# Patient Record
Sex: Male | Born: 1980 | Race: Black or African American | Hispanic: No | Marital: Married | Smoking: Light tobacco smoker
Health system: Southern US, Community
[De-identification: ages and names within clinical notes are randomized; demographics above are authoritative.]

---

## 1999-09-09 ENCOUNTER — Emergency Department (HOSPITAL_COMMUNITY): Admission: EM | Admit: 1999-09-09 | Discharge: 1999-09-09 | Payer: Self-pay | Admitting: Emergency Medicine

## 1999-09-09 ENCOUNTER — Encounter: Payer: Self-pay | Admitting: Emergency Medicine

## 2007-09-16 ENCOUNTER — Emergency Department (HOSPITAL_COMMUNITY): Admission: EM | Admit: 2007-09-16 | Discharge: 2007-09-16 | Payer: Self-pay | Admitting: Emergency Medicine

## 2008-07-07 ENCOUNTER — Emergency Department (HOSPITAL_COMMUNITY): Admission: EM | Admit: 2008-07-07 | Discharge: 2008-07-07 | Payer: Self-pay | Admitting: Family Medicine

## 2008-07-10 ENCOUNTER — Ambulatory Visit: Payer: Self-pay | Admitting: *Deleted

## 2008-07-10 DIAGNOSIS — J189 Pneumonia, unspecified organism: Secondary | ICD-10-CM | POA: Insufficient documentation

## 2008-08-10 ENCOUNTER — Ambulatory Visit (HOSPITAL_BASED_OUTPATIENT_CLINIC_OR_DEPARTMENT_OTHER): Admission: RE | Admit: 2008-08-10 | Discharge: 2008-08-10 | Payer: Self-pay | Admitting: *Deleted

## 2008-08-10 ENCOUNTER — Ambulatory Visit: Payer: Self-pay | Admitting: *Deleted

## 2008-08-10 ENCOUNTER — Ambulatory Visit: Payer: Self-pay | Admitting: Diagnostic Radiology

## 2008-08-10 DIAGNOSIS — E785 Hyperlipidemia, unspecified: Secondary | ICD-10-CM | POA: Insufficient documentation

## 2008-08-10 LAB — CONVERTED CEMR LAB
CO2: 27 meq/L (ref 19–32)
Calcium: 9.2 mg/dL (ref 8.4–10.5)
Chloride: 103 meq/L (ref 96–112)
Glucose, Bld: 86 mg/dL (ref 70–99)
Hemoglobin: 13.7 g/dL (ref 13.0–17.0)
LDL Cholesterol: 94 mg/dL (ref 0–99)
Lymphocytes Relative: 22.6 % (ref 12.0–46.0)
Monocytes Relative: 10.2 % (ref 3.0–12.0)
Neutro Abs: 4.1 10*3/uL (ref 1.4–7.7)
Neutrophils Relative %: 64.7 % (ref 43.0–77.0)
Potassium: 4.2 meq/L (ref 3.5–5.1)
RBC: 5.26 M/uL (ref 4.22–5.81)
RDW: 15.4 % — ABNORMAL HIGH (ref 11.5–14.6)
Sodium: 136 meq/L (ref 135–145)
TSH: 2.02 microintl units/mL (ref 0.35–5.50)
Total CHOL/HDL Ratio: 6.2
Triglycerides: 73 mg/dL (ref 0–149)
VLDL: 15 mg/dL (ref 0–40)

## 2009-01-11 ENCOUNTER — Ambulatory Visit: Payer: Self-pay | Admitting: Internal Medicine

## 2009-01-11 DIAGNOSIS — R634 Abnormal weight loss: Secondary | ICD-10-CM

## 2009-01-11 DIAGNOSIS — R05 Cough: Secondary | ICD-10-CM

## 2009-01-11 LAB — CONVERTED CEMR LAB
AST: 37 units/L (ref 0–37)
Albumin: 3.2 g/dL — ABNORMAL LOW (ref 3.5–5.2)
Alkaline Phosphatase: 413 units/L — ABNORMAL HIGH (ref 39–117)
Basophils Relative: 0.4 % (ref 0.0–3.0)
Bilirubin, Direct: 0 mg/dL (ref 0.0–0.3)
Eosinophils Relative: 1.8 % (ref 0.0–5.0)
Free T4: 1 ng/dL (ref 0.6–1.6)
Hemoglobin: 13.7 g/dL (ref 13.0–17.0)
Lymphocytes Relative: 26.2 % (ref 12.0–46.0)
Monocytes Relative: 9.6 % (ref 3.0–12.0)
Neutro Abs: 3.7 10*3/uL (ref 1.4–7.7)
Neutrophils Relative %: 62 % (ref 43.0–77.0)
RBC: 5.2 M/uL (ref 4.22–5.81)
TSH: 0.79 microintl units/mL (ref 0.35–5.50)
Total Protein: 8.5 g/dL — ABNORMAL HIGH (ref 6.0–8.3)
WBC: 5.9 10*3/uL (ref 4.5–10.5)

## 2009-01-12 ENCOUNTER — Telehealth: Payer: Self-pay | Admitting: Internal Medicine

## 2009-01-12 DIAGNOSIS — R748 Abnormal levels of other serum enzymes: Secondary | ICD-10-CM | POA: Insufficient documentation

## 2009-01-13 ENCOUNTER — Ambulatory Visit: Payer: Self-pay | Admitting: Diagnostic Radiology

## 2009-01-13 ENCOUNTER — Ambulatory Visit (HOSPITAL_BASED_OUTPATIENT_CLINIC_OR_DEPARTMENT_OTHER): Admission: RE | Admit: 2009-01-13 | Discharge: 2009-01-13 | Payer: Self-pay | Admitting: Internal Medicine

## 2009-01-13 ENCOUNTER — Telehealth: Payer: Self-pay | Admitting: Internal Medicine

## 2009-01-13 ENCOUNTER — Ambulatory Visit: Payer: Self-pay | Admitting: Internal Medicine

## 2009-01-14 ENCOUNTER — Telehealth: Payer: Self-pay | Admitting: Internal Medicine

## 2009-01-20 ENCOUNTER — Ambulatory Visit: Payer: Self-pay | Admitting: Emergency Medicine

## 2009-01-20 DIAGNOSIS — R930 Abnormal findings on diagnostic imaging of skull and head, not elsewhere classified: Secondary | ICD-10-CM

## 2009-01-20 LAB — CONVERTED CEMR LAB
INR: 1.3 — ABNORMAL HIGH (ref 0.8–1.0)
Prothrombin Time: 14.2 s — ABNORMAL HIGH (ref 10.9–13.3)
aPTT: 33.9 s — ABNORMAL HIGH (ref 21.7–28.8)

## 2009-01-26 ENCOUNTER — Ambulatory Visit: Admission: RE | Admit: 2009-01-26 | Discharge: 2009-01-26 | Payer: Self-pay | Admitting: Emergency Medicine

## 2009-01-26 ENCOUNTER — Ambulatory Visit: Payer: Self-pay | Admitting: Emergency Medicine

## 2009-01-26 ENCOUNTER — Encounter: Payer: Self-pay | Admitting: Emergency Medicine

## 2009-01-29 ENCOUNTER — Ambulatory Visit: Payer: Self-pay | Admitting: Emergency Medicine

## 2009-02-01 ENCOUNTER — Telehealth (INDEPENDENT_AMBULATORY_CARE_PROVIDER_SITE_OTHER): Payer: Self-pay | Admitting: *Deleted

## 2009-02-08 ENCOUNTER — Telehealth: Payer: Self-pay | Admitting: Emergency Medicine

## 2009-02-15 ENCOUNTER — Ambulatory Visit: Payer: Self-pay | Admitting: Internal Medicine

## 2009-02-15 ENCOUNTER — Ambulatory Visit (HOSPITAL_BASED_OUTPATIENT_CLINIC_OR_DEPARTMENT_OTHER): Admission: RE | Admit: 2009-02-15 | Discharge: 2009-02-15 | Payer: Self-pay | Admitting: Internal Medicine

## 2009-02-15 ENCOUNTER — Ambulatory Visit: Payer: Self-pay | Admitting: Diagnostic Radiology

## 2009-02-15 DIAGNOSIS — D689 Coagulation defect, unspecified: Secondary | ICD-10-CM

## 2009-02-15 LAB — CONVERTED CEMR LAB
ALT: 32 units/L (ref 0–53)
AST: 30 units/L (ref 0–37)
Albumin: 3.9 g/dL (ref 3.5–5.2)
Alkaline Phosphatase: 288 units/L — ABNORMAL HIGH (ref 39–117)
Bilirubin, Direct: 0.1 mg/dL (ref 0.0–0.3)
CRP: 1.8 mg/dL — ABNORMAL HIGH (ref <0.6)
GGT: 238 units/L — ABNORMAL HIGH (ref 7–51)
INR: 1 (ref 0.0–1.5)
Indirect Bilirubin: 0.4 mg/dL (ref 0.0–0.9)
Prothrombin Time: 13.9 s (ref 11.6–15.2)
Sed Rate: 10 mm/hr (ref 0–16)
Total Bilirubin: 0.5 mg/dL (ref 0.3–1.2)
Total Protein: 8.9 g/dL — ABNORMAL HIGH (ref 6.0–8.3)
aPTT: 36 s (ref 24–37)

## 2009-02-19 ENCOUNTER — Telehealth: Payer: Self-pay | Admitting: Internal Medicine

## 2009-02-26 ENCOUNTER — Encounter: Payer: Self-pay | Admitting: Emergency Medicine

## 2009-02-26 ENCOUNTER — Ambulatory Visit: Payer: Self-pay | Admitting: Emergency Medicine

## 2009-02-26 ENCOUNTER — Telehealth (INDEPENDENT_AMBULATORY_CARE_PROVIDER_SITE_OTHER): Payer: Self-pay | Admitting: *Deleted

## 2009-02-26 ENCOUNTER — Ambulatory Visit: Admission: RE | Admit: 2009-02-26 | Discharge: 2009-02-26 | Payer: Self-pay | Admitting: Emergency Medicine

## 2009-03-03 ENCOUNTER — Ambulatory Visit: Payer: Self-pay | Admitting: Emergency Medicine

## 2010-09-19 ENCOUNTER — Encounter: Payer: Self-pay | Admitting: Emergency Medicine

## 2010-09-25 LAB — CONVERTED CEMR LAB: Vit D, 1,25-Dihydroxy: 12 — ABNORMAL LOW (ref 30–89)

## 2010-12-04 LAB — CULTURE, RESPIRATORY W GRAM STAIN: Special Requests: ABNORMAL

## 2010-12-04 LAB — FUNGUS CULTURE W SMEAR: Fungal Smear: NONE SEEN

## 2010-12-04 LAB — AFB CULTURE WITH SMEAR (NOT AT ARMC): Special Requests: ABNORMAL

## 2010-12-05 LAB — CULTURE, RESPIRATORY W GRAM STAIN

## 2010-12-05 LAB — MISCELLANEOUS TEST

## 2010-12-05 LAB — FUNGUS CULTURE W SMEAR: Fungal Smear: NONE SEEN

## 2010-12-05 LAB — AFB CULTURE WITH SMEAR (NOT AT ARMC)

## 2011-01-10 NOTE — Op Note (Signed)
NAME:  Derek Maynard, Derek Maynard               ACCOUNT NO.:  [id number]   MEDICAL RECORD NO.:  [id number]          PATIENT TYPE:  AMB   LOCATION:  CARD                         FACILITY:  [facility]   PHYSICIAN:  Leslye Peer, MD    DATE OF BIRTH:  08/05/1981   DATE OF PROCEDURE:  02/26/2009  DATE OF DISCHARGE:                               OPERATIVE REPORT   PROCEDURE:  Fiberoptic bronchoscopy with transbronchial biopsies,  endobronchial biopsies, and endobronchial brushings.   INDICATION:  Abnormal CT scan of the chest.   OPERATOR:  Leslye Peer, MD.   MEDICATIONS GIVEN:  1. Fentanyl 150 mcg IV in divided doses.  2. Versed 3 mg IV in divided doses.  3. One percent lidocaine to the bronchoalveolar tree for approximately      32 mL total.   CONSENT:  Informed consent was obtained and a signed copy is on the  patient's chart.   PROCEDURE DETAILS:  After a formal time-out was performed and consent  was signed, conscious sedation was initiated as outlined above.  The  bronchoscope was introduced through the right naris without difficulty.  The posterior pharynx was visualized.  There was some nodularity  anterior to the epiglottis.  The cords were normal in appearance.  The  trachea was intubated without difficulty.  There was some white  nodularity on the mucosal wall anterior to the anterior left proximal  trachea.  Examination of the airways revealed a sharp main carina.  There was some mild hypopigmentation and white nodularity throughout the  entire exam.  This was especially notable in the left upper lobe and  lingular airways.  There was thickening of the left upper lobe lingular  carina as well as the right upper lobe carina.  Transbronchial biopsies  were performed under fluoroscopic guidance; first from the left upper  lobe and then from the right upper lobe.  These will be sent for  pathology.  Endobronchial brushings were then performed in the left  upper lobe airways in the  region of nodularity and also in the proximal  trachea in the region of nodularity.  These will be sent for cytology.  Finally, endobronchial biopsies were performed in the nodular lesions of  the left upper lobe and also the nodular region of the trachea.  These  will be sent for pathology.  Finally, bronchial washings were pooled and  these were sent for cytology and microbiology including AFB and fungal  smears.  The patient tolerated the procedure well.  There was  approximately 2 mL of blood loss total with good hemostasis at the end  of the case.  A chest x-ray to look for pneumothorax was pending at time  of this dictation.  He returned to recovery room in good condition.   SAMPLES:  1. Transbronchial biopsies from the left upper lobe.  2. Transbronchial biopsies from the right upper lobe.  3. Endobronchial brushings from the left upper lobe.  4. Endobronchial brushings from the trachea.  5. Endobronchial biopsies from the left upper lobe.  6. Endobronchial biopsies from the trachea.  7. Pooled bronchial  washings.   PLAN:  I will arrange for followup with Derek Maynard as an outpatient to  discuss his results and to plan further therapy.      Leslye Peer, MD  Electronically Signed     RSB/MEDQ  D:  02/26/2009  T:  02/26/2009  Job:  [id number]   cc:   Derek Corporal, MD

## 2011-01-10 NOTE — Op Note (Signed)
NAME:  Derek Maynard, Derek Maynard               ACCOUNT NO.:  [id number]   MEDICAL RECORD NO.:  [id number]          PATIENT TYPE:  AMB   LOCATION:  CARD                         FACILITY:  [facility]   PHYSICIAN:  Leslye Peer, MD    DATE OF BIRTH:  [birth year]   DATE OF PROCEDURE:  01/26/2009  DATE OF DISCHARGE:                               OPERATIVE REPORT   PROCEDURES:  1. Fiberoptic bronchoscopy with bronchoalveolar lavage.  2. Endobronchial biopsies.  3. Transbronchial biopsies and brushings.   OPERATOR:  Levy Pupa, MD.   INDICATION:  Abnormal CT scan of the chest.   CONSENT:  Informed consent was obtained from the patient, a signed copy  is in his hospital chart.   MEDICATIONS GIVEN:  1. Fentanyl 100 mcg IV in divided doses.  2. Versed 5 mg IV in divided doses.  3. Lidocaine 1% to the bronchoalveolar tree for approximately 42 mL      total.   PROCEDURE DETAILS:  After informed consent was obtained as outlined  above, conscious sedation was initiated.  The bronchoscope was  introduced through the oropharynx and the posterior pharynx and cords  were anesthetized with 1% lidocaine.  The trachea was intubated without  difficulty and lidocaine was administered to the trachea and distal  airways.  General inspection showed some white, pearly nodularity on the  trachea and then subsequently in all airways.  This was most prominent  in the right upper lobe airways as well as the lingular airways.  There  were no other abnormal secretions or masses noted.  Endobronchial  biopsies were performed from the right upper lobe, the trachea and the  lingula.  Brushings were performed in the trachea and the right upper  lobe.  Transbronchial biopsies were performed using fluoroscopic  guidance from the lingula.  Bronchial washings were pooled and were sent  for cytology and microbiology.  The patient tolerated the procedure  well.  There were no obvious complications.  A chest x-ray is pending at  the time of this dictation.   PLAN:  I will follow up the biopsy and microbiology results for Mr.  Maynard when they become available.      Leslye Peer, MD  Electronically Signed     RSB/MEDQ  D:  01/26/2009  T:  01/26/2009  Job:  [id number]

## 2012-04-25 ENCOUNTER — Emergency Department (HOSPITAL_COMMUNITY): Payer: Managed Care, Other (non HMO)

## 2012-04-25 ENCOUNTER — Emergency Department (HOSPITAL_COMMUNITY)
Admission: EM | Admit: 2012-04-25 | Discharge: 2012-04-25 | Disposition: A | Discharge status: Home / Self Care | Payer: Managed Care, Other (non HMO) | Attending: Emergency Medicine | Admitting: Emergency Medicine

## 2012-04-25 ENCOUNTER — Encounter (HOSPITAL_COMMUNITY): Payer: Self-pay | Admitting: Emergency Medicine

## 2012-04-25 DIAGNOSIS — R319 Hematuria, unspecified: Secondary | ICD-10-CM | POA: Insufficient documentation

## 2012-04-25 DIAGNOSIS — R42 Dizziness and giddiness: Secondary | ICD-10-CM | POA: Insufficient documentation

## 2012-04-25 DIAGNOSIS — R10819 Abdominal tenderness, unspecified site: Secondary | ICD-10-CM | POA: Insufficient documentation

## 2012-04-25 DIAGNOSIS — R509 Fever, unspecified: Secondary | ICD-10-CM | POA: Insufficient documentation

## 2012-04-25 DIAGNOSIS — R109 Unspecified abdominal pain: Secondary | ICD-10-CM | POA: Insufficient documentation

## 2012-04-25 DIAGNOSIS — J189 Pneumonia, unspecified organism: Secondary | ICD-10-CM | POA: Insufficient documentation

## 2012-04-25 DIAGNOSIS — R112 Nausea with vomiting, unspecified: Secondary | ICD-10-CM | POA: Insufficient documentation

## 2012-04-25 LAB — URINALYSIS, ROUTINE W REFLEX MICROSCOPIC
Ketones, ur: 15 mg/dL — AB
Leukocytes, UA: NEGATIVE
Nitrite: NEGATIVE
Protein, ur: 30 mg/dL — AB
pH: 6 (ref 5.0–8.0)

## 2012-04-25 LAB — COMPREHENSIVE METABOLIC PANEL
Alkaline Phosphatase: 80 U/L (ref 39–117)
BUN: 11 mg/dL (ref 6–23)
Creatinine, Ser: 0.99 mg/dL (ref 0.50–1.35)
GFR calc Af Amer: >90 mL/min (ref >90)
Glucose, Bld: 101 mg/dL — ABNORMAL HIGH (ref 70–99)
Potassium: 3.8 mEq/L (ref 3.5–5.1)
Total Bilirubin: 0.4 mg/dL (ref 0.3–1.2)
Total Protein: 7.6 g/dL (ref 6.0–8.3)

## 2012-04-25 LAB — CBC WITH DIFFERENTIAL/PLATELET
Basophils Relative: 0 % (ref 0–1)
Eosinophils Absolute: 0.1 10*3/uL (ref 0.0–0.7)
HCT: 38.8 % — ABNORMAL LOW (ref 39.0–52.0)
Hemoglobin: 14.6 g/dL (ref 13.0–17.0)
Lymphocytes Relative: 16 % (ref 12–46)
Lymphs Abs: 1.2 10*3/uL (ref 0.7–4.0)
MCH: 28.2 pg (ref 26.0–34.0)
MCHC: 37.6 g/dL — ABNORMAL HIGH (ref 30.0–36.0)
MCV: 75 fL — ABNORMAL LOW (ref 78.0–100.0)
Neutro Abs: 5.1 10*3/uL (ref 1.7–7.7)

## 2012-04-25 LAB — URINE MICROSCOPIC-ADD ON

## 2012-04-25 MED ORDER — AZITHROMYCIN 250 MG PO TABS: 250.0000 mg | ORAL_TABLET | Freq: Every day | ORAL | Status: AC

## 2012-04-25 MED ORDER — IOHEXOL 300 MG/ML  SOLN
100.0000 mL | Freq: Once | INTRAMUSCULAR | Status: AC | PRN
  Administered 2012-04-25: 100 mL via INTRAVENOUS

## 2012-04-25 MED ORDER — ACETAMINOPHEN 325 MG PO TABS
650.0000 mg | ORAL_TABLET | Freq: Once | ORAL | Status: AC
  Administered 2012-04-25: 650 mg via ORAL
  Filled 2012-04-25: qty 2

## 2012-04-25 MED ORDER — HYDROMORPHONE HCL PF 1 MG/ML IJ SOLN
1.0000 mg | Freq: Once | INTRAMUSCULAR | Status: AC
  Administered 2012-04-25: 1 mg via INTRAVENOUS
  Filled 2012-04-25: qty 1

## 2012-04-25 MED ORDER — SODIUM CHLORIDE 0.9 % IV BOLUS (SEPSIS)
1000.0000 mL | Freq: Once | INTRAVENOUS | Status: AC
  Administered 2012-04-25: 1000 mL via INTRAVENOUS

## 2012-04-25 MED ORDER — OXYCODONE-ACETAMINOPHEN 5-325 MG PO TABS: 1.0000 | ORAL_TABLET | Freq: Four times a day (QID) | ORAL | Status: AC | PRN

## 2012-04-25 MED ORDER — ONDANSETRON HCL 4 MG/2ML IJ SOLN
4.0000 mg | Freq: Once | INTRAMUSCULAR | Status: AC
  Administered 2012-04-25: 4 mg via INTRAVENOUS
  Filled 2012-04-25: qty 2

## 2012-04-25 MED ORDER — CEFTRIAXONE SODIUM 1 G IJ SOLR
1.0000 g | Freq: Once | INTRAMUSCULAR | Status: AC
  Administered 2012-04-25: 1 g via INTRAMUSCULAR
  Filled 2012-04-25: qty 10

## 2012-04-25 NOTE — ED Provider Notes (Signed)
History     CSN: [id number]  Arrival date & time 04/25/12  1107   First MD Initiated Contact with Patient 04/25/12 1202      Chief Complaint  Patient presents with  . Abdominal Pain    (Consider location/radiation/quality/duration/timing/severity/associated sxs/prior treatment) HPI Comments: 31 y/o male presents with abdominal pain x 3 days. Went to urgent care this morning and was sent to ED to r/o appendicitis. States pain is intermittent, sharp and stabbing, rated 7/10 worse with sitting up and movements. Has some mild relief with laying flat. Admits to associated fever which ibuprofen only provides temporary relief. TMax 102 for the past few days. Had an appetite up until this morning when he became nauseated and began vomiting. Admits to mild dizziness and generalized body aches. Denies chest pain, sob, bowel changes, urinary complaints.  Patient is a 31 y.o. male presenting with abdominal pain. The history is provided by the patient.  Abdominal Pain The primary symptoms of the illness include abdominal pain, fever, nausea and vomiting. The primary symptoms of the illness do not include shortness of breath or dysuria.  Symptoms associated with the illness do not include back pain.    History reviewed. No pertinent past medical history.  History reviewed. No pertinent past surgical history.  History reviewed. No pertinent family history.  History  Substance Use Topics  . Smoking status: Never Smoker   . Smokeless tobacco: Not on file  . Alcohol Use: 2.4 oz/week    2 Cans of beer, 2 Glasses of wine per week     drnks on weekendsd      Review of Systems  Constitutional: Positive for fever and appetite change.  HENT: Negative for mouth sores, neck pain and neck stiffness.   Respiratory: Negative for shortness of breath.   Cardiovascular: Negative for chest pain.  Gastrointestinal: Positive for nausea, vomiting and abdominal pain.  Genitourinary: Negative for dysuria,  flank pain and difficulty urinating.  Musculoskeletal: Positive for myalgias. Negative for back pain.  Skin: Negative for rash.  Neurological: Positive for dizziness. Negative for weakness and headaches.  Psychiatric/Behavioral: Negative for confusion.    Allergies  Review of patient's allergies indicates no known allergies.  Home Medications   Current Outpatient Rx  Name Route Sig Dispense Refill  . IBUPROFEN 200 MG PO TABS Oral Take 800 mg by mouth every 6 (six) hours as needed. Fever and pain      BP 135/70  Pulse 100  Temp 101.8 F (38.8 C) (Oral)  Resp 16  Ht 5\' 7"  (1.702 m)  Wt 205 lb (92.987 kg)  BMI 32.11 kg/m2  SpO2 98%  Physical Exam  Constitutional: He is oriented to person, place, and time. He appears well-developed and well-nourished. No distress.  HENT:  Head: Normocephalic and atraumatic.  Mouth/Throat: Oropharynx is clear and moist.  Eyes: Conjunctivae are normal.  Neck: Normal range of motion. Neck supple.  Cardiovascular: Normal rate, regular rhythm and normal heart sounds.   Pulmonary/Chest: Effort normal and breath sounds normal.  Abdominal: Soft. Bowel sounds are normal. There is tenderness in the right lower quadrant and epigastric area. There is guarding. There is no rigidity, no rebound and no CVA tenderness.       Positive rovzing's sign  Musculoskeletal: Normal range of motion. He exhibits no edema.  Neurological: He is alert and oriented to person, place, and time.  Skin: Skin is warm. No rash noted. He is not diaphoretic.       Very warm skin  Psychiatric: He has a normal mood and affect. His behavior is normal.    ED Course  Procedures (including critical care time)   Labs Reviewed  CBC WITH DIFFERENTIAL  COMPREHENSIVE METABOLIC PANEL  URINALYSIS, ROUTINE W REFLEX MICROSCOPIC   No results found.  Results for orders placed during the hospital encounter of 04/25/12  CBC WITH DIFFERENTIAL      Component Value Range   WBC 7.2  4.0 -  10.5 K/uL   RBC 5.17  4.22 - 5.81 MIL/uL   Hemoglobin 14.6  13.0 - 17.0 g/dL   HCT 16.1 (*) 09.6 - 04.5 %   MCV 75.0 (*) 78.0 - 100.0 fL   MCH 28.2  26.0 - 34.0 pg   MCHC 37.6 (*) 30.0 - 36.0 g/dL   RDW 40.9  81.1 - 91.4 %   Platelets 160  150 - 400 K/uL   Neutrophils Relative 72  43 - 77 %   Lymphocytes Relative 16  12 - 46 %   Monocytes Relative 11  3 - 12 %   Eosinophils Relative 1  0 - 5 %   Basophils Relative 0  0 - 1 %   Neutro Abs 5.1  1.7 - 7.7 K/uL   Lymphs Abs 1.2  0.7 - 4.0 K/uL   Monocytes Absolute 0.8  0.1 - 1.0 K/uL   Eosinophils Absolute 0.1  0.0 - 0.7 K/uL   Basophils Absolute 0.0  0.0 - 0.1 K/uL   Smear Review MORPHOLOGY UNREMARKABLE    COMPREHENSIVE METABOLIC PANEL      Component Value Range   Sodium 133 (*) 135 - 145 mEq/L   Potassium 3.8  3.5 - 5.1 mEq/L   Chloride 98  96 - 112 mEq/L   CO2 24  19 - 32 mEq/L   Glucose, Bld 101 (*) 70 - 99 mg/dL   BUN 11  6 - 23 mg/dL   Creatinine, Ser 7.82  0.50 - 1.35 mg/dL   Calcium 9.0  8.4 - 95.6 mg/dL   Total Protein 7.6  6.0 - 8.3 g/dL   Albumin 4.0  3.5 - 5.2 g/dL   AST 25  0 - 37 U/L   ALT 24  0 - 53 U/L   Alkaline Phosphatase 80  39 - 117 U/L   Total Bilirubin 0.4  0.3 - 1.2 mg/dL   GFR calc non Af Amer >90  >90 mL/min   GFR calc Af Amer >90  >90 mL/min  LIPASE, BLOOD      Component Value Range   Lipase 22  11 - 59 U/L    No diagnosis found.    MDM  31 y/o male sent to ED from urgent care to r/o appy. Patient febrile, tender in epigastric and RLQ. Positive rovzing. Will obtain labs and CT scan. 3:49 PM Case discussed with Pixie Casino, PA-C who will take over care of patient at this time.        Trevor Mace, PA-C 04/25/12 1549

## 2012-04-25 NOTE — ED Notes (Signed)
Pt. Referred from [facility].  Pt. Went there this morning for RLQ pain, fever, chills, nausea and vomiting.  [facility] sent him to ED for further evaluation.  Pt. Reports temperature has been 102 for the last two days.  Has taken ibuprofen for pain.  Lying down helps relieve symptoms.  Pt. Also states he has a headache.

## 2012-04-25 NOTE — ED Notes (Signed)
Pt aware urine is needed for testing, urinal at bedside, informed to call when able.

## 2012-04-25 NOTE — ED Provider Notes (Signed)
Medical screening examination/treatment/procedure(s) were performed by non-physician practitioner and as supervising physician I was immediately available for consultation/collaboration.   Gerhard Munch, MD 04/25/12 [phone]

## 2012-04-27 LAB — URINE CULTURE
Culture: NO GROWTH
Special Requests: NORMAL

## 2013-06-28 ENCOUNTER — Ambulatory Visit (INDEPENDENT_AMBULATORY_CARE_PROVIDER_SITE_OTHER): Payer: Managed Care, Other (non HMO) | Admitting: Physician Assistant

## 2013-06-28 VITALS — BP 130/82 | HR 94 | Temp 97.8°F | Resp 18 | Ht 68.0 in | Wt 210.0 lb

## 2013-06-28 DIAGNOSIS — Z202 Contact with and (suspected) exposure to infections with a predominantly sexual mode of transmission: Secondary | ICD-10-CM

## 2013-06-28 DIAGNOSIS — Z2089 Contact with and (suspected) exposure to other communicable diseases: Secondary | ICD-10-CM

## 2013-06-28 MED ORDER — METRONIDAZOLE 500 MG PO TABS: 2000.0000 mg | ORAL_TABLET | Freq: Once | ORAL | Status: AC

## 2013-06-28 NOTE — Progress Notes (Signed)
  Subjective:    Patient ID: Derek Maynard, male    DOB: 1980/11/14, 32 y.o.   MRN: [id number]  Exposure to STD The patient's pertinent negatives include no penile discharge, penile pain or testicular pain. Pertinent negatives include no abdominal pain, chills, dysuria, fever, nausea, rash or vomiting.   32 year old male presents for STD testing.  States his male partner was told 3 days ago that she has trichomonas.  He is here for treatment of this. He was last sexually active with her about 1 week ago. Denies any other partners.  Unsure if she was monogamous or not.  He denies any symptoms -  No dysuria, penile drainage, abdominal pain, nausea, vomiting, rash, or lesions.  Was tested for GC/CL, HIV, and syphilis about 1 month ago. He does not wish to be tested for these today.  Patient is otherwise healthy with no other concerns today.     Review of Systems  Constitutional: Negative for fever and chills.  Gastrointestinal: Negative for nausea, vomiting and abdominal pain.  Genitourinary: Negative for dysuria, discharge, penile pain and testicular pain.  Skin: Negative for rash.       Objective:   Physical Exam  Constitutional: He is oriented to person, place, and time. He appears well-developed and well-nourished.  HENT:  Head: Normocephalic and atraumatic.  Right Ear: External ear normal.  Left Ear: External ear normal.  Eyes: Conjunctivae are normal.  Neck: Normal range of motion.  Cardiovascular: Normal rate, regular rhythm and normal heart sounds.   Pulmonary/Chest: Effort normal and breath sounds normal.  Neurological: He is alert and oriented to person, place, and time.  Psychiatric: He has a normal mood and affect. His behavior is normal. Judgment and thought content normal.          Assessment & Plan:  Exposure to venereal disease - Plan: Trichomonas vaginalis, RNA, metroNIDAZOLE (FLAGYL) 500 MG tablet  Will go ahead and treat with Flagyl 2 gram po once Lab  pending. Patient declined GC/CL, HIV, and syphilis  Follow up as needed.

## 2013-07-01 LAB — TRICHOMONAS VAGINALIS, PROBE AMP: T vaginalis RNA: POSITIVE — AB

## 2013-10-15 ENCOUNTER — Emergency Department (HOSPITAL_BASED_OUTPATIENT_CLINIC_OR_DEPARTMENT_OTHER)
Admission: EM | Admit: 2013-10-15 | Discharge: 2013-10-15 | Disposition: A | Discharge status: Home / Self Care | Payer: Managed Care, Other (non HMO) | Attending: Emergency Medicine | Admitting: Emergency Medicine

## 2013-10-15 ENCOUNTER — Encounter (HOSPITAL_BASED_OUTPATIENT_CLINIC_OR_DEPARTMENT_OTHER): Payer: Self-pay | Admitting: Emergency Medicine

## 2013-10-15 ENCOUNTER — Emergency Department (HOSPITAL_BASED_OUTPATIENT_CLINIC_OR_DEPARTMENT_OTHER): Payer: Managed Care, Other (non HMO)

## 2013-10-15 DIAGNOSIS — F17200 Nicotine dependence, unspecified, uncomplicated: Secondary | ICD-10-CM | POA: Insufficient documentation

## 2013-10-15 DIAGNOSIS — R69 Illness, unspecified: Secondary | ICD-10-CM

## 2013-10-15 DIAGNOSIS — J111 Influenza due to unidentified influenza virus with other respiratory manifestations: Secondary | ICD-10-CM | POA: Insufficient documentation

## 2013-10-15 DIAGNOSIS — M2550 Pain in unspecified joint: Secondary | ICD-10-CM | POA: Insufficient documentation

## 2013-10-15 NOTE — Discharge Instructions (Signed)

## 2013-10-15 NOTE — ED Provider Notes (Signed)
CSN: [id number]     Arrival date & time 10/15/13  0756 History   First MD Initiated Contact with Patient 10/15/13 [phone]     Chief Complaint  Patient presents with  . Cough  . Generalized Body Aches     (Consider location/radiation/quality/duration/timing/severity/associated sxs/prior Treatment) HPI Comments: Patient presents with two-day history of body aches, productive cough and chills since yesterday. Denies checking his temperature. Denies any chest pain or shortness of breath. He has had sick contacts at home. Did not receive a flu shot. No dominant pain or vomiting. No chest pain or shortness of breath. Says he is a nonsmoker. Denies any headache or sore throat.  The history is provided by the patient.    History reviewed. No pertinent past medical history. History reviewed. No pertinent past surgical history. Family History  Problem Relation Age of Onset  . Stroke Maternal Grandmother   . Cancer Maternal Grandfather   . Hypertension Paternal Grandmother    History  Substance Use Topics  . Smoking status: Light Tobacco Smoker  . Smokeless tobacco: Not on file  . Alcohol Use: 2.4 oz/week    2 Glasses of wine, 2 Cans of beer per week     Comment: drnks on weekendsd    Review of Systems  Constitutional: Positive for chills, activity change, appetite change and fatigue. Negative for fever.  HENT: Positive for congestion and rhinorrhea.   Respiratory: Positive for cough. Negative for shortness of breath.   Cardiovascular: Negative for chest pain.  Gastrointestinal: Negative for nausea, vomiting and abdominal pain.  Genitourinary: Negative for dysuria.  Musculoskeletal: Positive for arthralgias and myalgias. Negative for neck pain and neck stiffness.  Neurological: Negative for headaches.  A complete 10 system review of systems was obtained and all systems are negative except as noted in the HPI and PMH.      Allergies  Review of patient's allergies indicates no known  allergies.  Home Medications   Current Outpatient Rx  Name  Route  Sig  Dispense  Refill  . ibuprofen (ADVIL,MOTRIN) 200 MG tablet   Oral   Take 800 mg by mouth every 6 (six) hours as needed. Fever and pain         . metroNIDAZOLE (FLAGYL) 500 MG tablet   Oral   Take 4 tablets (2,000 mg total) by mouth once. DO NOT CONSUME ALCOHOL WHILE TAKING THIS MEDICATION.   4 tablet   0    BP 148/93  Pulse 91  Temp(Src) 98.9 F (37.2 C) (Oral)  Resp 16  SpO2 99% Physical Exam  Constitutional: He is oriented to person, place, and time. He appears well-developed and well-nourished. No distress.  HENT:  Head: Normocephalic and atraumatic.  Right Ear: External ear normal.  Left Ear: External ear normal.  Mouth/Throat: Oropharynx is clear and moist. No oropharyngeal exudate.  Eyes: Conjunctivae and EOM are normal. Pupils are equal, round, and reactive to light.  Neck: Normal range of motion. Neck supple.  No meningismus  Cardiovascular: Normal rate, regular rhythm and normal heart sounds.   No murmur heard. Pulmonary/Chest: Effort normal and breath sounds normal. No respiratory distress.  Abdominal: Soft. Bowel sounds are normal. There is no tenderness. There is no rebound and no guarding.  Musculoskeletal: Normal range of motion. He exhibits no edema and no tenderness.  Neurological: He is alert and oriented to person, place, and time. No cranial nerve deficit. He exhibits normal muscle tone. Coordination normal.  Skin: Skin is warm.    ED  Course  Procedures (including critical care time) Labs Review Labs Reviewed - No data to display Imaging Review No results found.  EKG Interpretation   None       MDM   Final diagnoses:  Influenza-like illness   Cough, bodyaches, chills, sick contacts at home.  Did not receive flu shot.  Vitals stable, afebrile. No distress.  Suspect viral syndrome, possibly influenza.  Chest x-ray negative for infiltrate. Patient is in no  distress. Discussed supportive care for influenza-like illness and viral syndrome. Recommend antipyretics, oral hydration. Return precautions discussed.  BP 148/93  Pulse 91  Temp(Src) 98.9 F (37.2 C) (Oral)  Resp 16  SpO2 99%    Glynn Octave, MD 10/15/13 [phone]

## 2013-10-15 NOTE — ED Notes (Signed)
Pt c/o back and leg aches, cough productive and chills since yesterday. Pt sts he did not take otc meds. Pt sts fiance sick also.

## 2016-01-29 ENCOUNTER — Emergency Department (HOSPITAL_BASED_OUTPATIENT_CLINIC_OR_DEPARTMENT_OTHER): Payer: Managed Care, Other (non HMO)

## 2016-01-29 ENCOUNTER — Encounter (HOSPITAL_BASED_OUTPATIENT_CLINIC_OR_DEPARTMENT_OTHER): Payer: Self-pay | Admitting: Emergency Medicine

## 2016-01-29 DIAGNOSIS — R05 Cough: Secondary | ICD-10-CM | POA: Diagnosis present

## 2016-01-29 DIAGNOSIS — F17200 Nicotine dependence, unspecified, uncomplicated: Secondary | ICD-10-CM | POA: Insufficient documentation

## 2016-01-29 DIAGNOSIS — J189 Pneumonia, unspecified organism: Secondary | ICD-10-CM | POA: Diagnosis not present

## 2016-01-29 NOTE — ED Notes (Signed)
Patient states that he has had a cough and congestion x 2 -3 days

## 2016-01-30 ENCOUNTER — Encounter (HOSPITAL_BASED_OUTPATIENT_CLINIC_OR_DEPARTMENT_OTHER): Payer: Self-pay | Admitting: Emergency Medicine

## 2016-01-30 ENCOUNTER — Emergency Department (HOSPITAL_BASED_OUTPATIENT_CLINIC_OR_DEPARTMENT_OTHER)
Admission: EM | Admit: 2016-01-30 | Discharge: 2016-01-30 | Disposition: A | Discharge status: Home / Self Care | Payer: Managed Care, Other (non HMO) | Attending: Emergency Medicine | Admitting: Emergency Medicine

## 2016-01-30 DIAGNOSIS — J189 Pneumonia, unspecified organism: Secondary | ICD-10-CM

## 2016-01-30 MED ORDER — AZITHROMYCIN 250 MG PO TABS
500.0000 mg | ORAL_TABLET | Freq: Once | ORAL | Status: AC
  Administered 2016-01-30: 500 mg via ORAL
  Filled 2016-01-30: qty 2

## 2016-01-30 MED ORDER — AZITHROMYCIN 250 MG PO TABS: ORAL_TABLET | ORAL | Status: AC

## 2016-01-30 NOTE — ED Provider Notes (Signed)
CSN: [id number]     Arrival date & time 01/29/16  2259 History   By signing my name below, I, Hollace Hayward, attest that this documentation has been prepared under the direction and in the presence of Kalii Chesmore, MD.  Electronically Signed: Hollace Hayward, ED Scribe. 01/30/2016. 12:51 AM.  Chief Complaint  Patient presents with  . Cough   Patient is a 35 y.o. male presenting with cough. The history is provided by the patient.  Cough Cough characteristics:  Non-productive Severity:  Moderate Onset quality:  Gradual Duration:  2 days Timing:  Sporadic Progression:  Unchanged Chronicity:  New Context: not animal exposure   Relieved by:  Nothing Worsened by:  Nothing tried Ineffective treatments:  Decongestant Associated symptoms: chills   Associated symptoms: no chest pain, no diaphoresis, no fever and no rhinorrhea    HPI Comments: Derek Maynard is a 35 y.o. male who presents to the Emergency Department complaining of a gradual onset, gradually worsening dry cough that began 3 days ago. Pt has cold sweats chills and congestion. Pt has been taking Benadryl and Mucinex with no relief. Pt has not had any sick contact with similar symptoms. Pt denies fevers.   History reviewed. No pertinent past medical history. History reviewed. No pertinent past surgical history. Family History  Problem Relation Age of Onset  . Stroke Maternal Grandmother   . Cancer Maternal Grandfather   . Hypertension Paternal Grandmother    Social History  Substance Use Topics  . Smoking status: Light Tobacco Smoker  . Smokeless tobacco: None  . Alcohol Use: 2.4 oz/week    2 Glasses of wine, 2 Cans of beer per week     Comment: drnks on weekendsd    Review of Systems  Constitutional: Positive for chills. Negative for fever and diaphoresis.  HENT: Positive for congestion. Negative for rhinorrhea.   Respiratory: Positive for cough.   Cardiovascular: Negative for chest pain.  All other systems reviewed  and are negative.     Allergies  Review of patient's allergies indicates no known allergies.  Home Medications   Prior to Admission medications   Medication Sig Start Date End Date Taking? Authorizing Provider  ibuprofen (ADVIL,MOTRIN) 200 MG tablet Take 800 mg by mouth every 6 (six) hours as needed. Fever and pain    Historical Provider, MD  metroNIDAZOLE (FLAGYL) 500 MG tablet Take 4 tablets (2,000 mg total) by mouth once. DO NOT CONSUME ALCOHOL WHILE TAKING THIS MEDICATION. 06/28/13   Heather M Marte, PA-C   BP 138/87 mmHg  Pulse 69  Temp(Src) 98.7 F (37.1 C) (Oral)  Resp 22  Ht  (1.702 m)  Wt 220 lb (99.791 kg)  BMI 34.45 kg/m2  SpO2 100%   Physical Exam  Constitutional: He is oriented to person, place, and time. He appears well-developed and well-nourished.  HENT:  Head: Normocephalic and atraumatic.  Mouth/Throat: Oropharynx is clear and moist. No oropharyngeal exudate.  Eyes: Conjunctivae and EOM are normal. Pupils are equal, round, and reactive to light.  Neck: Normal range of motion. Neck supple. No JVD present. No tracheal deviation present.  Cardiovascular: Normal rate, regular rhythm and normal heart sounds.  Exam reveals no gallop and no friction rub.   No murmur heard. RRR.   Pulmonary/Chest: Effort normal and breath sounds normal. No stridor. No respiratory distress. He has no wheezes. He has no rales.  Lungs CTA bilaterally.   Abdominal: Soft. Bowel sounds are normal. He exhibits no distension. There is no tenderness.  There is no rebound and no guarding.  Musculoskeletal: Normal range of motion.  Lymphadenopathy:    He has no cervical adenopathy.  Neurological: He is alert and oriented to person, place, and time. He has normal reflexes.  Skin: Skin is warm and dry.  Psychiatric: He has a normal mood and affect.  Nursing note and vitals reviewed.   ED Course  Procedures (including critical care time) DIAGNOSTIC STUDIES: Oxygen Saturation is 100%  on RA, normal by my interpretation.   COORDINATION OF CARE: 12:50 AM-Discussed next steps with pt including Azithromycin and f/u w/ PCP. Pt verbalized understanding and is agreeable with the plan.   Labs Review Labs Reviewed - No data to display  Imaging Review Dg Chest 2 View  01/29/2016  CLINICAL DATA:  Acute onset of productive cough and chills. Body aches. Initial encounter. EXAM: CHEST  2 VIEW COMPARISON:  Chest radiograph performed 10/15/2013 FINDINGS: The lungs are well-aerated. Minimal right midlung opacity could reflect mild pneumonia. Pulmonary vascularity is at the upper limits of normal. There is no evidence of pleural effusion or pneumothorax. The heart is borderline normal in size. No acute osseous abnormalities are seen. IMPRESSION: Minimal right midlung opacity could reflect mild pneumonia. Electronically Signed   By: Roanna RaiderJeffery  Chang M.D.   On: 01/29/2016 23:20   I have personally reviewed and evaluated these images and lab results as part of my medical decision-making.   EKG Interpretation None      MDM   Final diagnoses:  None   Filed Vitals:   01/29/16 2304 01/30/16 0058  BP: 138/87 132/86  Pulse: 69 72  Temp: 98.7 F (37.1 C) 97.3 F (36.3 C)  Resp: 22 18    Results for orders placed or performed in visit on 06/28/13  Trichomonas vaginalis, RNA  Result Value Ref Range   T vaginalis RNA Positive (A)    Dg Chest 2 View  01/29/2016  CLINICAL DATA:  Acute onset of productive cough and chills. Body aches. Initial encounter. EXAM: CHEST  2 VIEW COMPARISON:  Chest radiograph performed 10/15/2013 FINDINGS: The lungs are well-aerated. Minimal right midlung opacity could reflect mild pneumonia. Pulmonary vascularity is at the upper limits of normal. There is no evidence of pleural effusion or pneumothorax. The heart is borderline normal in size. No acute osseous abnormalities are seen. IMPRESSION: Minimal right midlung opacity could reflect mild pneumonia.  Electronically Signed   By: Roanna RaiderJeffery  Chang M.D.   On: 01/29/2016 23:20     Medications  azithromycin (ZITHROMAX) tablet 500 mg (500 mg Oral Given 01/30/16 0057)   Will given Zpak and told to follow up with his PMD strict return precautions given    I personally performed the services described in this documentation, which was scribed in my presence. The recorded information has been reviewed and is accurate.       Cy BlamerApril Vincenza Dail, MD 01/30/16 [phone]

## 2016-01-30 NOTE — Discharge Instructions (Signed)

## 2021-12-08 ENCOUNTER — Emergency Department (HOSPITAL_COMMUNITY): Payer: BC Managed Care – PPO

## 2021-12-08 ENCOUNTER — Other Ambulatory Visit: Payer: Self-pay

## 2021-12-08 ENCOUNTER — Encounter (HOSPITAL_COMMUNITY): Payer: Self-pay | Admitting: Emergency Medicine

## 2021-12-08 ENCOUNTER — Emergency Department (HOSPITAL_COMMUNITY)
Admission: EM | Admit: 2021-12-08 | Discharge: 2021-12-08 | Disposition: A | Discharge status: Home / Self Care | Payer: BC Managed Care – PPO | Attending: Emergency Medicine | Admitting: Emergency Medicine

## 2021-12-08 DIAGNOSIS — K610 Anal abscess: Secondary | ICD-10-CM | POA: Diagnosis not present

## 2021-12-08 LAB — I-STAT CHEM 8, ED
BUN: 14 mg/dL (ref 6–20)
Calcium, Ion: 1.15 mmol/L (ref 1.15–1.40)
Chloride: 103 mmol/L (ref 98–111)
Creatinine, Ser: 0.9 mg/dL (ref 0.61–1.24)
Glucose, Bld: 88 mg/dL (ref 70–99)
HCT: 44 % (ref 39.0–52.0)
Hemoglobin: 15 g/dL (ref 13.0–17.0)
Potassium: 3.6 mmol/L (ref 3.5–5.1)
Sodium: 139 mmol/L (ref 135–145)
TCO2: 25 mmol/L (ref 22–32)

## 2021-12-08 MED ORDER — AMOXICILLIN-POT CLAVULANATE 875-125 MG PO TABS: 1.0000 | ORAL_TABLET | Freq: Two times a day (BID) | ORAL | 0 refills | Status: AC

## 2021-12-08 MED ORDER — IOHEXOL 300 MG/ML  SOLN
100.0000 mL | Freq: Once | INTRAMUSCULAR | Status: AC | PRN
  Administered 2021-12-08: 100 mL via INTRAVENOUS

## 2021-12-08 MED ORDER — HYDROCODONE-ACETAMINOPHEN 5-325 MG PO TABS: 1.0000 | ORAL_TABLET | ORAL | 0 refills | Status: AC | PRN

## 2021-12-08 MED ORDER — LIDOCAINE HCL (PF) 1 % IJ SOLN
30.0000 mL | Freq: Once | INTRAMUSCULAR | Status: AC
  Administered 2021-12-08: 30 mL
  Filled 2021-12-08: qty 30

## 2021-12-08 NOTE — ED Provider Triage Note (Signed)
Emergency Medicine Provider Triage Evaluation Note ? ?Elder Cyphers , a 41 y.o. male  was evaluated in triage.  Pt complains of anal pain starting 1 month ago.  Did some warm salt baths and the symptoms subsided.  3 days ago symptoms came back and pain is worsened.  Notes that he feels a lump near his anus/rectum.  Went to an urgent care today and was told he appeared to have a deep anal abscess that would likely need CT imaging and/or surgery.  Denies back pain, fever, recent IV drug use, nausea or vomiting, or abdominal pain denies constipation, diarrhea, urinary changes. ? ?Review of Systems  ?Positive: As above ?Negative: As above ? ?Physical Exam  ?BP (!) 156/96 (BP Location: Right Arm)   Pulse 98   Temp 98.5 ?F (36.9 ?C) (Oral)   Resp 18   SpO2 98%  ?Gen:   Awake, no distress   ?Resp:  Normal effort  ?MSK:   Moves extremities without difficulty  ?Other:  RRR w/o M/R/G; afebrile ? ?Medical Decision Making  ?Medically screening exam initiated at 12:54 PM.  Appropriate orders placed.  Zedric Lamar Benes was informed that the remainder of the evaluation will be completed by another provider, this initial triage assessment does not replace that evaluation, and the importance of remaining in the ED until their evaluation is complete. ? ? ?  ?Cecil Cobbs, PA-C ?12/08/21 1259 ? ?

## 2021-12-08 NOTE — Discharge Instructions (Signed)
Soak in a warm tub 2 or 3 times a day for 20 to 30 minutes.  Remove 1 inch of packing each day and cut it off.  We put about 5 inches of packing in.  After the packing is all removed continue using warm soaks to keep the area clean and help healing.  We are prescribing an antibiotic and narcotics to use for pain relief if needed ?

## 2021-12-08 NOTE — ED Triage Notes (Signed)
Patient has had anal pain for months which has worsened over time. Last night the pain was unbearable. Today he was seen at an urgent care and they discovered a large anorectal abscess on the right that extents to the perianal ring. He was told to come here for CT and possible surgery. ?

## 2021-12-08 NOTE — ED Provider Notes (Signed)
?[facility]-EMERGENCY DEPT ?Provider Note ? ? ?CSN: [id number] ?Arrival date & time: 12/08/21  1232 ? ?  ? ?History ? ?Chief Complaint  ?Patient presents with  ? Abscess  ? ? ?Derek Maynard is a 41 y.o. male. ? ?HPI ?Patient presenting for evaluation of pain in the anus, with a lump.  He went to an urgent care today and was told he had an abscess, so he was referred here.  He states pain has been present for 3 days and occurs with sitting, moving, walking and having bowel movements.  Had a similar episode about a month ago but it resolved after he soaked in a tub.  He denies fever, vomiting, abdominal pain. ?  ? ?Home Medications ?Prior to Admission medications   ?Medication Sig Start Date End Date Taking? Authorizing Provider  ?amoxicillin-clavulanate (AUGMENTIN) 875-125 MG tablet Take 1 tablet by mouth 2 (two) times daily. One po bid x 7 days 12/08/21  Yes Mancel Bale, MD  ?HYDROcodone-acetaminophen (NORCO) 5-325 MG tablet Take 1 tablet by mouth every 4 (four) hours as needed for moderate pain. 12/08/21  Yes Mancel Bale, MD  ?azithromycin (ZITHROMAX Z-PAK) 250 MG tablet 2 po day one, then 1 daily x 4 days 01/30/16   Palumbo, April, MD  ?ibuprofen (ADVIL,MOTRIN) 200 MG tablet Take 800 mg by mouth every 6 (six) hours as needed. Fever and pain    [provider]  ?metroNIDAZOLE (FLAGYL) 500 MG tablet Take 4 tablets (2,000 mg total) by mouth once. DO NOT CONSUME ALCOHOL WHILE TAKING THIS MEDICATION. 06/28/13   Nelva Nay, PA-C  ?   ? ?Allergies    ?Patient has no known allergies.   ? ?Review of Systems   ?Review of Systems ? ?Physical Exam ?Updated Vital Signs ?BP (!) 155/90   Pulse 80   Temp 98.5 ?F (36.9 ?C) (Oral)   Resp 16   SpO2 100%  ?Physical Exam ?Vitals and nursing note reviewed.  ?Constitutional:   ?   Appearance: He is well-developed. He is not ill-appearing.  ?HENT:  ?   Head: Normocephalic and atraumatic.  ?   Right Ear: External ear normal.  ?   Left Ear: External  ear normal.  ?Eyes:  ?   Conjunctiva/sclera: Conjunctivae normal.  ?   Pupils: Pupils are equal, round, and reactive to light.  ?Neck:  ?   Trachea: Phonation normal.  ?Cardiovascular:  ?   Rate and Rhythm: Normal rate.  ?Pulmonary:  ?   Effort: Pulmonary effort is normal.  ?Abdominal:  ?   General: There is no distension.  ?Genitourinary: ?   Comments: Right-sided perianal mass, about 4 cm, mostly in the buttocks region.  The swelling starts about 2 cm from the anal orifice.  It extends slightly anterior towards the perineum.  No associated genital abnormality ?Musculoskeletal:     ?   General: Normal range of motion.  ?   Cervical back: Normal range of motion and neck supple.  ?Skin: ?   General: Skin is warm and dry.  ?Neurological:  ?   Mental Status: He is alert and oriented to person, place, and time.  ?   Cranial Nerves: No cranial nerve deficit.  ?   Sensory: No sensory deficit.  ?   Motor: No abnormal muscle tone.  ?   Coordination: Coordination normal.  ?Psychiatric:     ?   Mood and Affect: Mood normal.     ?   Behavior: Behavior normal.     ?  Thought Content: Thought content normal.     ?   Judgment: Judgment normal.  ? ? ?ED Results / Procedures / Treatments   ?Labs ?(all labs ordered are listed, but only abnormal results are displayed) ?Labs Reviewed  ?I-STAT CHEM 8, ED  ? ? ?EKG ?None ? ?Radiology ?CT PELVIS W CONTRAST ? ?Result Date: 12/08/2021 ?CLINICAL DATA:  Perianal abscess. EXAM: CT PELVIS WITH CONTRAST TECHNIQUE: Multidetector CT imaging of the pelvis was performed using the standard protocol following the bolus administration of intravenous contrast. RADIATION DOSE REDUCTION: This exam was performed according to the departmental dose-optimization program which includes automated exposure control, adjustment of the mA and/or kV according to patient size and/or use of iterative reconstruction technique. CONTRAST:  100mL OMNIPAQUE IOHEXOL 300 MG/ML  SOLN COMPARISON:  CT of the abdomen and pelvis  on 04/25/2012 FINDINGS: Urinary Tract:  The bladder is normal. Bowel: Pelvic bowel loops demonstrate no evidence of inflammation, dilatation or lesion. No free air identified in the pelvis. Vascular/Lymphatic: No pathologically enlarged lymph nodes. No significant vascular abnormality seen. Reproductive:  Prostate gland is unremarkable. Other: Right-sided perirectal inflammation extends into the medial gluteal fat. Focal perirectal/perianal abscess present with surrounding inflammation. Liquified component is difficult to measure given the surrounding inflammation but measures roughly 1.7 cm in width on axial images and 2 cm in height on coronal images. Musculoskeletal: No suspicious bone lesions identified. IMPRESSION: Small right-sided perirectal/perianal abscess with surrounding inflammation. Maximal diameter of liquified component of the abscess is roughly 1.7-2.0 cm. Electronically Signed   By: Irish LackGlenn  Yamagata M.D.   On: 12/08/2021 17:10   ? ?Procedures ?Marland Kitchen..Incision and Drainage ? ?Date/Time: 12/08/2021 6:31 PM ?Performed by: Mancel BaleWentz, Tiffanyann Deroo, MD ?Authorized by: Mancel BaleWentz, Kensie Susman, MD  ? ?Consent:  ?  Consent obtained:  Verbal ?Universal protocol:  ?  Procedure explained and questions answered to patient or proxy's satisfaction: yes   ?  Patient identity confirmed:  Verbally with patient ?Location:  ?  Type:  Abscess ?  Location: Right perianal/buttock. ?Pre-procedure details:  ?  Skin preparation:  Povidone-iodine ?Sedation:  ?  Sedation type:  None ?Anesthesia:  ?  Anesthesia method:  Local infiltration ?  Local anesthetic:  Lidocaine 1% w/o epi ?Procedure type:  ?  Complexity:  Simple ?Procedure details:  ?  Ultrasound guidance: no   ?  Needle aspiration: no   ?  Incision types:  Single straight ?  Incision depth:  Subcutaneous ?  Wound management:  Probed and deloculated ?  Drainage:  Bloody ?  Drainage amount:  Moderate ?  Wound treatment:  Drain placed ?  Packing materials:  1/4 in iodoform gauze (5 inches) ?   Amount 1/4" iodoform:  5 inches ?Post-procedure details:  ?  Procedure completion:  Tolerated well, no immediate complications  ? ? ?Medications Ordered in ED ?Medications  ?iohexol (OMNIPAQUE) 300 MG/ML solution 100 mL (100 mLs Intravenous Contrast Given 12/08/21 1700)  ?lidocaine (PF) (XYLOCAINE) 1 % injection 30 mL (30 mLs Infiltration Given 12/08/21 1827)  ? ? ?ED Course/ Medical Decision Making/ A&P ?  ?                        ?Medical Decision Making ?Patient with intermittent abscess symptoms of the right buttock, near the anus.  Will be evaluated for perirectal component, with CT imaging. ? ?Problems Addressed: ?Perianal abscess: acute illness or injury ?   Details: Recurrent, scar seen at site of current swelling. ? ?Amount and/or Complexity of  Data Reviewed ?Independent Historian:  ?   Details: He is a cogent historian ?Labs:  ?   Details: I-STAT 8 pre-ECT, to assess kidney function, normal ?Radiology: ordered. ?   Details: CT pelvis indicates perianal abscess ? ?Risk ?Prescription drug management. ?Decision regarding hospitalization. ?Risk Details: Patient with recurrent perianal abscess, pain with defecation walking.  He required I&D.  Packing placed posterior drainage procedure.  We will also give prescription for Augmentin to use.  He is instructed to remove packing, progressively initiated time over the next 5 days.  Instructed to return here as needed for problems.  He understands that this problem may return, and require operative intervention.There is no indication for hospitalization at this time. ? ? ? ? ? ? ? ? ? ? ?Final Clinical Impression(s) / ED Diagnoses ?Final diagnoses:  ?Perianal abscess  ? ? ?Rx / DC Orders ?ED Discharge Orders   ? ?      Ordered  ?  HYDROcodone-acetaminophen (NORCO) 5-325 MG tablet  Every 4 hours PRN       ? 12/08/21 1817  ?  amoxicillin-clavulanate (AUGMENTIN) 875-125 MG tablet  2 times daily       ? 12/08/21 1817  ? ?  ?  ? ?  ? ? ?  ?Mancel Bale, MD ?12/10/21  1329 ? ?

## 2023-06-25 ENCOUNTER — Ambulatory Visit: Payer: Self-pay

## 2023-06-25 ENCOUNTER — Other Ambulatory Visit: Payer: Self-pay | Admitting: Family Medicine

## 2023-06-25 DIAGNOSIS — Z021 Encounter for pre-employment examination: Secondary | ICD-10-CM

## 2023-08-19 IMAGING — CT CT PELVIS W/ CM
2 of 4 series · 15 of 46 positions shown, 17 images · IV contrast (agent unspecified)
Comparison: CT of the abdomen and pelvis on 04/25/2012

CLINICAL DATA: Perianal abscess.

EXAM:
CT PELVIS WITH CONTRAST
TECHNIQUE: Multidetector CT imaging of the pelvis was performed using the
standard protocol following the bolus administration of intravenous
contrast.

[Series 2: axial st · axial · 0.87mm/px · z∈[-415,-110]mm · 12 of 71 slices shown, 14 images]
[im 5/71  soft-tissue]
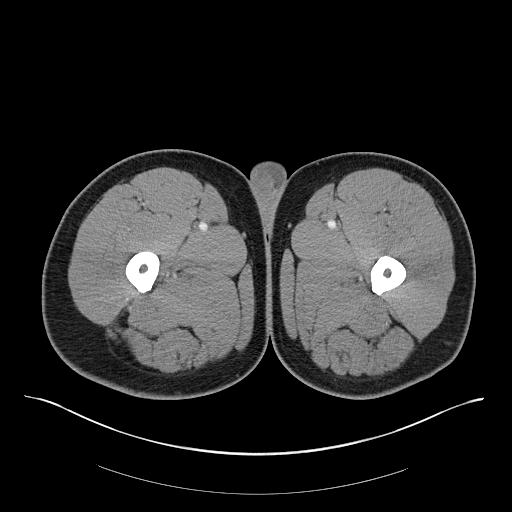
[im 5/71  bone]
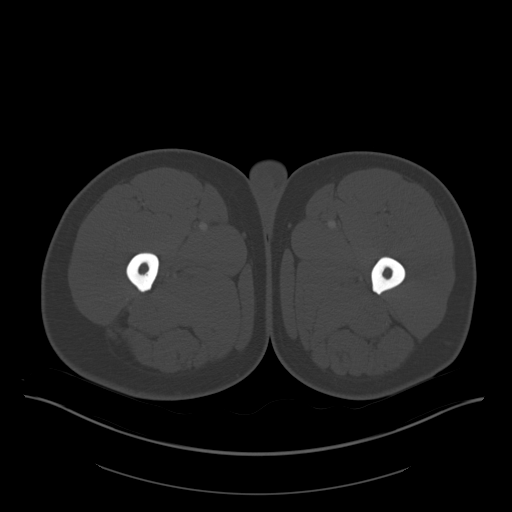
[im 10/71  soft-tissue]
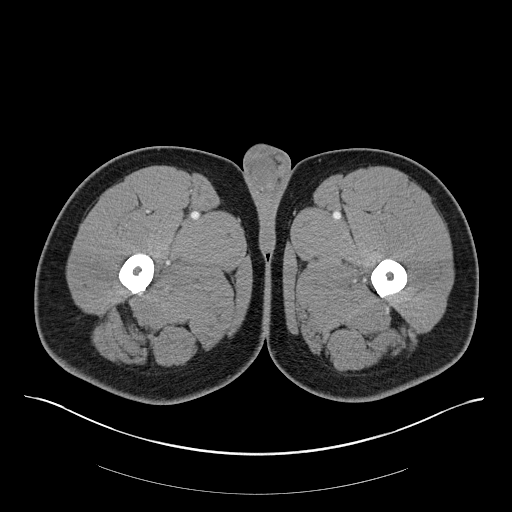
[im 16/71  soft-tissue]
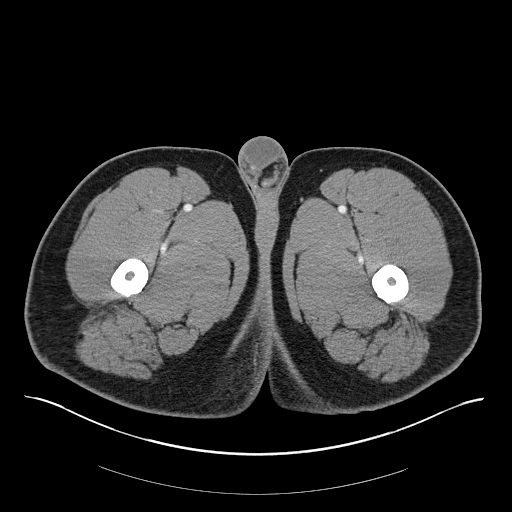
[im 21/71  soft-tissue]
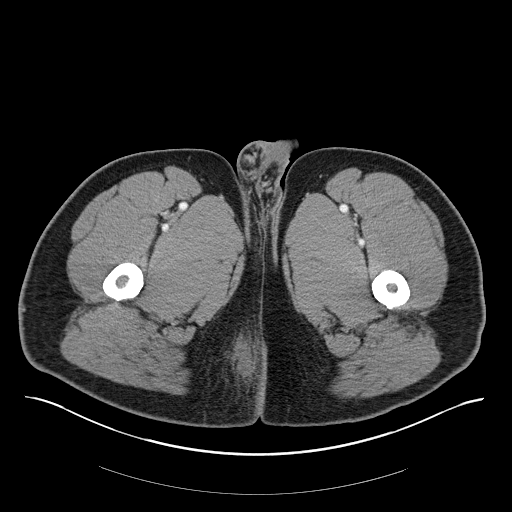
[im 28/71  soft-tissue]
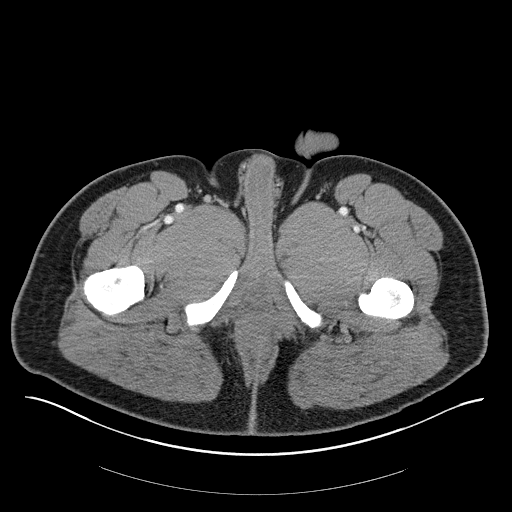
[im 32/71  soft-tissue]
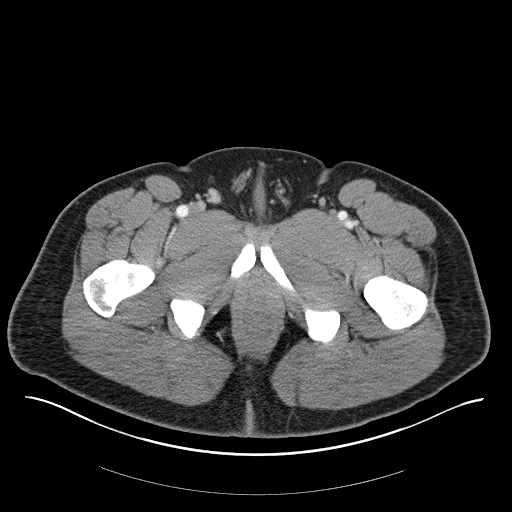
[im 39/71  soft-tissue]
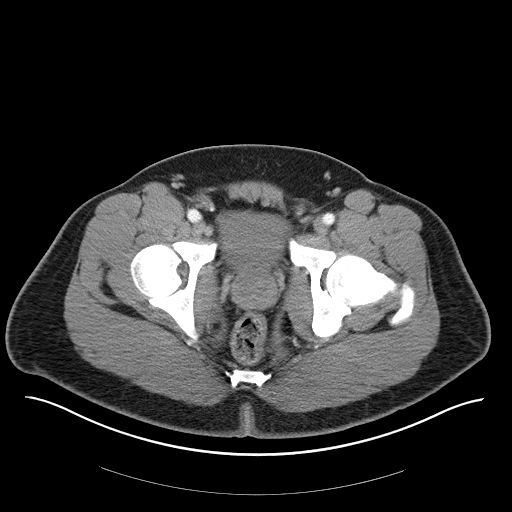
[im 43/71  soft-tissue]
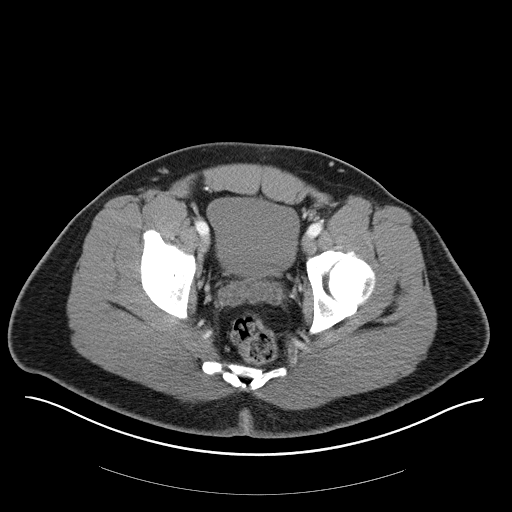
[im 50/71  soft-tissue]
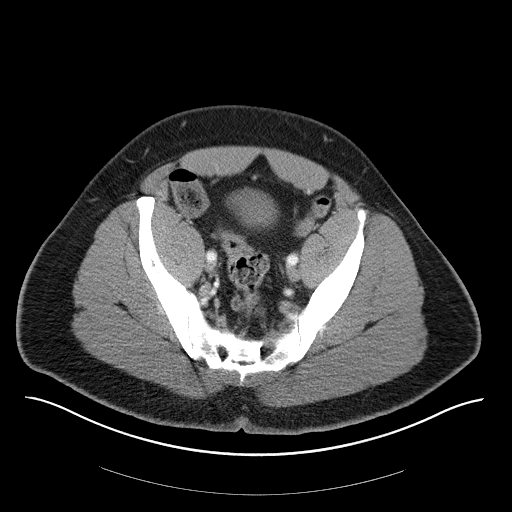
[im 50/71  bone]
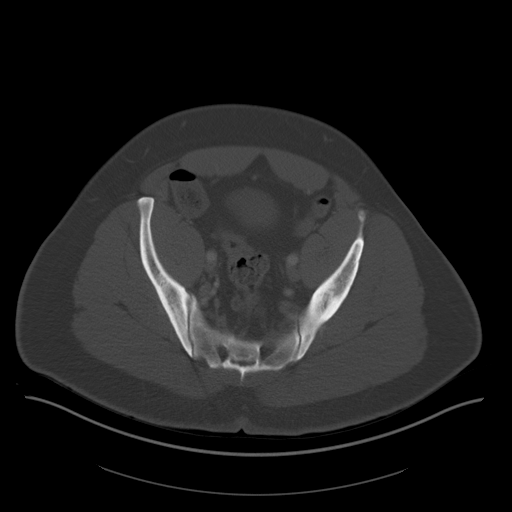
[im 55/71  soft-tissue]
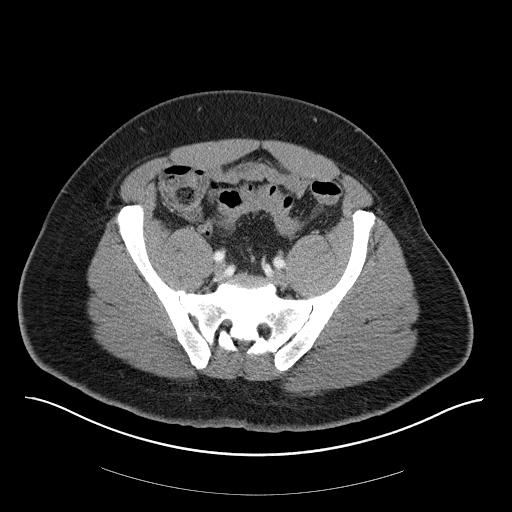
[im 61/71  soft-tissue]
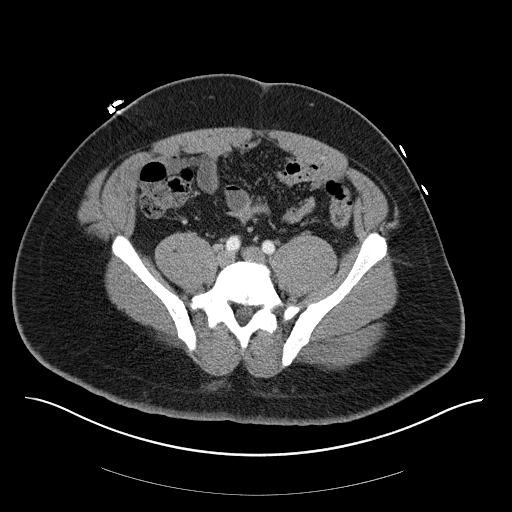
[im 66/71  soft-tissue]
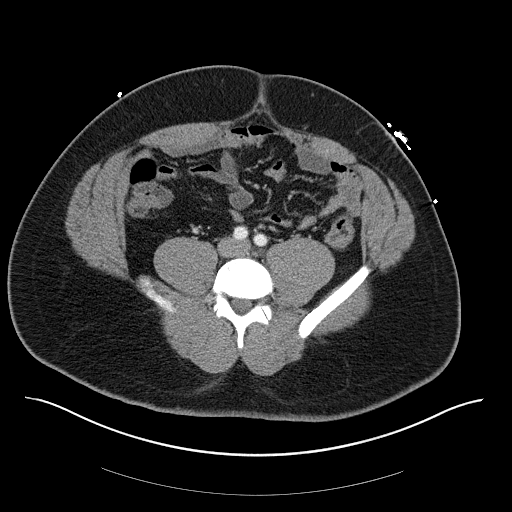

[Series 8: coronal st · coronal · 0.69mm/px · 3 of 162 slices shown]
[im 54/162  soft-tissue]
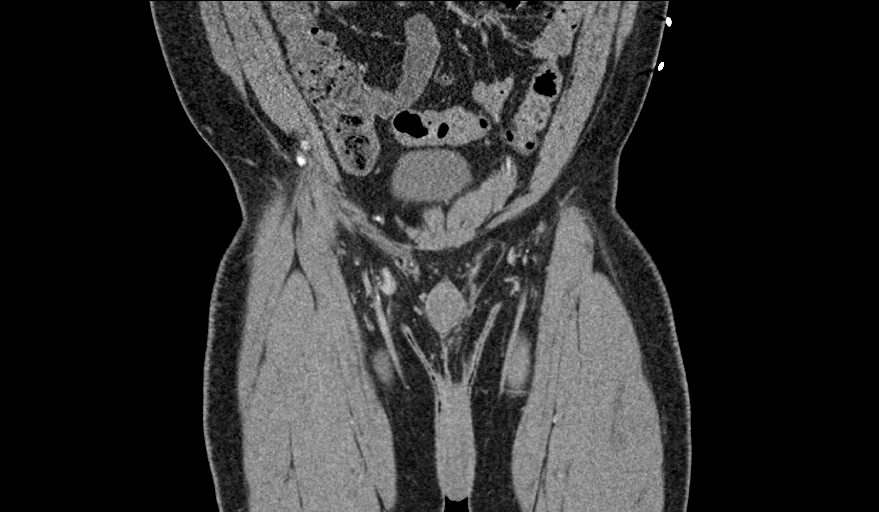
[im 72/162  soft-tissue]
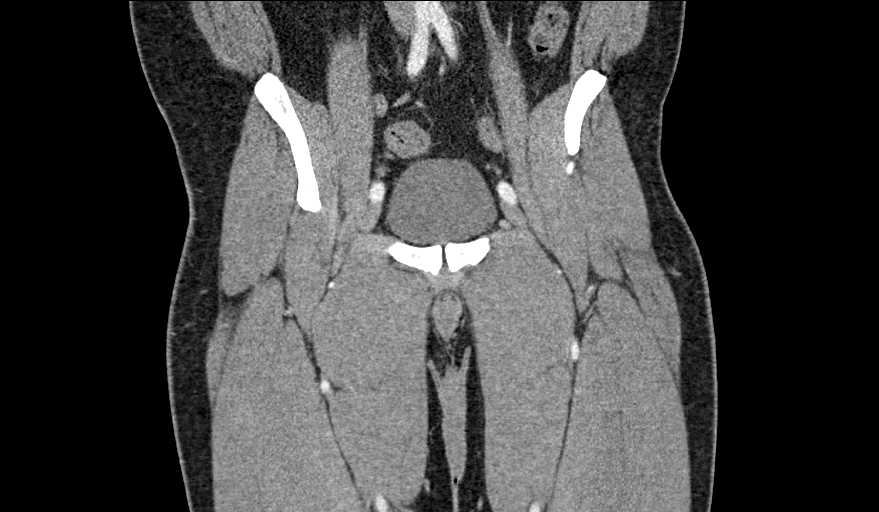
[im 90/162  soft-tissue]
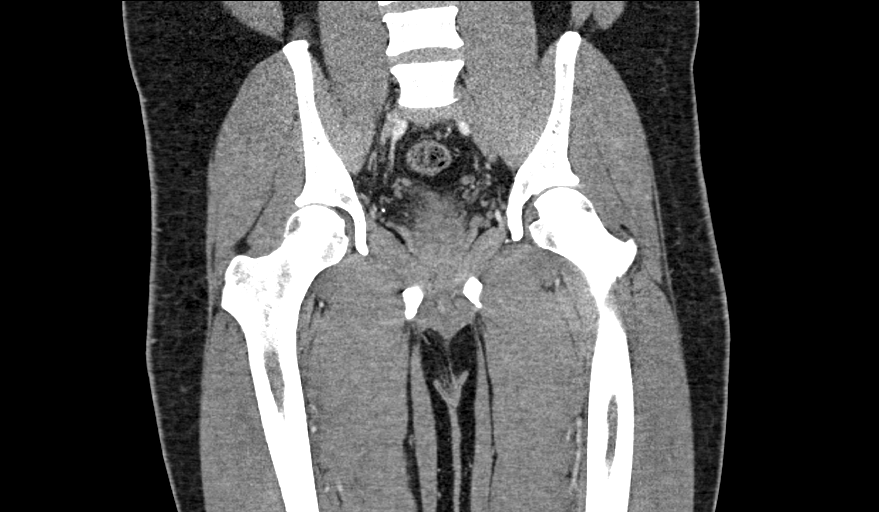

[15 of 46 positions shown; findings below may reference images not displayed]

RADIATION DOSE REDUCTION: This exam was performed according to the
departmental dose-optimization program which includes automated
exposure control, adjustment of the mA and/or kV according to
patient size and/or use of iterative reconstruction technique.

CONTRAST:  100mL OMNIPAQUE IOHEXOL 300 MG/ML  SOLN
FINDINGS: Urinary Tract:  The bladder is normal.

Bowel: Pelvic bowel loops demonstrate no evidence of inflammation,
dilatation or lesion. No free air identified in the pelvis.

Vascular/Lymphatic: No pathologically enlarged lymph nodes. No
significant vascular abnormality seen.

Reproductive:  Prostate gland is unremarkable.

Other: Right-sided perirectal inflammation extends into the medial
gluteal fat. Focal perirectal/perianal abscess present with
surrounding inflammation. Liquified component is difficult to
measure given the surrounding inflammation but measures roughly
cm in width on axial images and 2 cm in height on coronal images.

Musculoskeletal: No suspicious bone lesions identified.
IMPRESSION: Small right-sided perirectal/perianal abscess with surrounding
inflammation. Maximal diameter of liquified component of the abscess
is roughly 1.7-2.0 cm.
